# Patient Record
Sex: Male | Born: 1980 | Hispanic: Yes | Marital: Married | Smoking: Never smoker
Health system: Southern US, Community
[De-identification: ages and names within clinical notes are randomized; demographics above are authoritative.]

---

## 2011-04-14 ENCOUNTER — Emergency Department (INDEPENDENT_AMBULATORY_CARE_PROVIDER_SITE_OTHER): Payer: Worker's Compensation

## 2011-04-14 ENCOUNTER — Emergency Department (HOSPITAL_BASED_OUTPATIENT_CLINIC_OR_DEPARTMENT_OTHER)
Admission: EM | Admit: 2011-04-14 | Discharge: 2011-04-15 | Disposition: A | Discharge status: Home / Self Care | Payer: Worker's Compensation | Attending: Emergency Medicine | Admitting: Emergency Medicine

## 2011-04-14 ENCOUNTER — Encounter: Payer: Self-pay | Admitting: *Deleted

## 2011-04-14 DIAGNOSIS — W261XXA Contact with sword or dagger, initial encounter: Secondary | ICD-10-CM

## 2011-04-14 DIAGNOSIS — S61209A Unspecified open wound of unspecified finger without damage to nail, initial encounter: Secondary | ICD-10-CM

## 2011-04-14 DIAGNOSIS — S61219A Laceration without foreign body of unspecified finger without damage to nail, initial encounter: Secondary | ICD-10-CM

## 2011-04-14 DIAGNOSIS — Y9289 Other specified places as the place of occurrence of the external cause: Secondary | ICD-10-CM | POA: Insufficient documentation

## 2011-04-14 DIAGNOSIS — W260XXA Contact with knife, initial encounter: Secondary | ICD-10-CM | POA: Insufficient documentation

## 2011-04-14 NOTE — ED Notes (Signed)
Laceration to left thumb from box cutter

## 2011-04-14 NOTE — ED Notes (Signed)
Pt states this was an on the job injury, but denies needing a drug screen.

## 2011-04-15 MED ORDER — TETANUS-DIPHTH-ACELL PERTUSSIS 5-2.5-18.5 LF-MCG/0.5 IM SUSP
0.5000 mL | Freq: Once | INTRAMUSCULAR | Status: AC
  Administered 2011-04-15: 0.5 mL via INTRAMUSCULAR
  Filled 2011-04-15: qty 0.5

## 2011-04-15 MED ORDER — LIDOCAINE HCL (PF) 1 % IJ SOLN
INTRAMUSCULAR | Status: AC
  Administered 2011-04-15: 01:00:00
  Filled 2011-04-15: qty 5

## 2011-04-15 MED ORDER — LIDOCAINE HCL (PF) 1 % IJ SOLN
5.0000 mL | Freq: Once | INTRAMUSCULAR | Status: AC
  Administered 2011-04-15: 5 mL

## 2011-04-15 NOTE — ED Notes (Signed)
Laceration repair performed by Dr Bernette Mayers. Two stiches placed. Pt tolerated well. Site cleaned with saline, bacitracin applied, and gauze applied.

## 2011-04-15 NOTE — ED Provider Notes (Signed)
History     CSN: [id number] Arrival date & time: 04/14/2011 10:44 PM  Chief Complaint  Patient presents with  . Laceration   HPI Pt reports he cut his L thumb with a knife at work just prior to arrival. Complaining of mild, sharp pain, worse with movement. Bleeding is controlled. Tetanus is unknown.   History reviewed. No pertinent past medical history.  History reviewed. No pertinent past surgical history.  History reviewed. No pertinent family history.  History  Substance Use Topics  . Smoking status: Never Smoker   . Smokeless tobacco: Not on file  . Alcohol Use: No      Review of Systems All other systems reviewed and are negative except as noted in HPI.   Physical Exam  BP 115/75  Pulse 64  Temp(Src) 98.5 F (36.9 C) (Oral)  Resp 16  Ht 5\' 5"  (1.651 m)  Wt 155 lb (70.308 kg)  BMI 25.79 kg/m2  SpO2 100%  Physical Exam  Constitutional: He is oriented to person, place, and time. He appears well-developed and well-nourished.  HENT:  Head: Normocephalic and atraumatic.  Neck: Neck supple.  Pulmonary/Chest: Effort normal.  Neurological: He is alert and oriented to person, place, and time. No cranial nerve deficit.  Skin:       2cm superficial laceration to L thenar eminence  Psychiatric: He has a normal mood and affect. His behavior is normal.    ED Course  LACERATION REPAIR Date/Time: 04/15/2011 1:07 AM Performed by: Susy Frizzle B. Authorized by: Pollyann Savoy Consent: Verbal consent obtained. Consent given by: patient Time out: Immediately prior to procedure a "time out" was called to verify the correct patient, procedure, equipment, support staff and site/side marked as required. Body area: upper extremity Location details: left thumb Laceration length: 2 cm Foreign bodies: no foreign bodies Anesthesia: local infiltration Local anesthetic: lidocaine 1% without epinephrine Preparation: Patient was prepped and draped in the usual sterile  fashion. Irrigation solution: saline Irrigation method: syringe Amount of cleaning: standard Debridement: none Skin closure: 4-0 nylon Number of sutures: 2 Technique: simple Approximation: close Approximation difficulty: simple    MDM Wound care instructions given. Tetanus updated.       Teagyn Fishel B. Bernette Mayers, MD 04/15/11 4098

## 2017-04-13 ENCOUNTER — Encounter (HOSPITAL_BASED_OUTPATIENT_CLINIC_OR_DEPARTMENT_OTHER): Payer: Self-pay | Admitting: *Deleted

## 2017-04-13 ENCOUNTER — Emergency Department (HOSPITAL_BASED_OUTPATIENT_CLINIC_OR_DEPARTMENT_OTHER): Payer: Worker's Compensation

## 2017-04-13 ENCOUNTER — Emergency Department (HOSPITAL_BASED_OUTPATIENT_CLINIC_OR_DEPARTMENT_OTHER)
Admission: EM | Admit: 2017-04-13 | Discharge: 2017-04-14 | Disposition: A | Discharge status: Home / Self Care | Payer: Worker's Compensation | Attending: Emergency Medicine | Admitting: Emergency Medicine

## 2017-04-13 DIAGNOSIS — Y9289 Other specified places as the place of occurrence of the external cause: Secondary | ICD-10-CM | POA: Diagnosis not present

## 2017-04-13 DIAGNOSIS — S161XXA Strain of muscle, fascia and tendon at neck level, initial encounter: Secondary | ICD-10-CM | POA: Insufficient documentation

## 2017-04-13 DIAGNOSIS — S199XXA Unspecified injury of neck, initial encounter: Secondary | ICD-10-CM | POA: Diagnosis present

## 2017-04-13 DIAGNOSIS — Y9389 Activity, other specified: Secondary | ICD-10-CM | POA: Diagnosis not present

## 2017-04-13 DIAGNOSIS — W11XXXA Fall on and from ladder, initial encounter: Secondary | ICD-10-CM | POA: Diagnosis not present

## 2017-04-13 DIAGNOSIS — Y990 Civilian activity done for income or pay: Secondary | ICD-10-CM | POA: Insufficient documentation

## 2017-04-13 DIAGNOSIS — W19XXXA Unspecified fall, initial encounter: Secondary | ICD-10-CM

## 2017-04-13 NOTE — ED Triage Notes (Signed)
Pt amb into triage , states fall from tree x 5 days ago, sent here from [facility] for  back and neck pain

## 2017-04-14 MED ORDER — IBUPROFEN 800 MG PO TABS: 800.0000 mg | ORAL_TABLET | Freq: Three times a day (TID) | ORAL | 0 refills | Status: DC

## 2017-04-14 MED ORDER — CYCLOBENZAPRINE HCL 10 MG PO TABS: 10.0000 mg | ORAL_TABLET | Freq: Two times a day (BID) | ORAL | 0 refills | Status: DC | PRN

## 2017-04-14 NOTE — Discharge Instructions (Signed)
Flexeril es un medicamento que se puede tomar para los espasmos musculares y la rigidez muscular. Puede tomar este medicamento ONEOKhasta dos veces por da. No conduzca ni trabaje despus de tomar este medicamento porque puede provocarle sueo. Puede tomar 800 mg de ibuprofeno cada 8 horas con alimentos segn sea necesario para controlar el dolor y la inflamacin. Si desarrolla un fuerte dolor de cabeza, visin doble u otros nuevos sntomas graves, regrese al departamento de emergencias para una nueva evaluacin.  Flexeril is a medicine that can be taken for muscle spasms and muscle tightness. You can take this medicine up to two times per day. Please do not drive or work after taking this medicine because it can make you sleepy.   You may take 800 mg of ibuprofen every 8 hours with food as needed for pain and inflammation control.   If you develop a severe headache, double vision, or other new severe symptoms please return to the Emergency Department for re-evaluation.

## 2017-04-14 NOTE — ED Provider Notes (Signed)
[facility]   CSN: [id number] Arrival date & time: 04/13/17  2058     History   Chief Complaint Chief Complaint  Patient presents with  . Fall    HPI Kenneth Ashley is a 36 y.o. male who presents to the emergency department with a chief complaint of fall x5 days ago and was sent to the emergency department after being evaluated at urgent care earlier today. He reports that he was working and a tree while standing on a ladder when he fell approximately 8-10 feet. He reports that he was able to ambulate. No loss of consciousness. No nausea or emesis. He reports that he has been able to work, but has had worsening neck and low back pain and right leg pain over the last 2 days. He denies numbness, tingling, diplopia, or severe headache. No urinary or fecal incontinence. He reports he has been ambulatory since the fall. He is treated his symptoms with diclofenac at home without improvement. No history of previous neck or back surgery.  The history is provided by the patient. No language interpreter was used.    History reviewed. No pertinent past medical history.  There are no active problems to display for this patient.   History reviewed. No pertinent surgical history.     Home Medications    Prior to Admission medications   Medication Sig Start Date End Date Taking? Authorizing Provider  cyclobenzaprine (FLEXERIL) 10 MG tablet Take 1 tablet (10 mg total) by mouth 2 (two) times daily as needed for muscle spasms. 04/14/17   Bryndan Bilyk A, PA-C  ibuprofen (ADVIL,MOTRIN) 800 MG tablet Take 1 tablet (800 mg total) by mouth 3 (three) times daily. 04/14/17   Larnie Heart A, PA-C    Family History No family history on file.  Social History Social History  Substance Use Topics  . Smoking status: Never Smoker  . Smokeless tobacco: Not on file  . Alcohol use No     Allergies   Patient has no known allergies.   Review of Systems Review of Systems    Constitutional: Negative for activity change.  Eyes: Negative for visual disturbance.  Respiratory: Negative for shortness of breath.   Cardiovascular: Negative for chest pain.  Gastrointestinal: Negative for abdominal pain.  Musculoskeletal: Positive for arthralgias, back pain, myalgias and neck pain. Negative for gait problem and joint swelling.  Skin: Negative for rash.     Physical Exam Updated Vital Signs BP 122/85 (BP Location: Left Arm)   Pulse 67   Temp 98.5 F (36.9 C) (Oral)   Resp 14   Ht 5' 5.5" (1.664 m)   Wt 87.1 kg (192 lb)   SpO2 100%   BMI 31.46 kg/m   Physical Exam  Constitutional: He appears well-developed.  HENT:  Head: Normocephalic.  Eyes: Pupils are equal, round, and reactive to light. Conjunctivae and EOM are normal.  Neck: Normal range of motion. Neck supple. No tracheal deviation present.  Full active and passive range of motion with flexion, extension, rotation, and lateral flexion.  Cardiovascular: Normal rate, regular rhythm and normal heart sounds.  Exam reveals no gallop and no friction rub.   No murmur heard. Pulmonary/Chest: Effort normal and breath sounds normal. No respiratory distress. He has no wheezes. He has no rales.  Abdominal: Soft. He exhibits no distension.  Musculoskeletal:  No tenderness to palpation over the cervical, thoracic, or lumbar spinous processes. Tenderness to palpation over the bilateral cervical paraspinal muscles. The bilateral trapezius  muscles appear tight and thickened. No tenderness to palpation over the paraspinal muscles of the thoracic spine. Diffuse tenderness to palpation over the muscles of the bilateral lower back.  There is a 3 x 3 area of ecchymosis to the right buttock. Tender to palpation over the right mid lower leg with overlying ecchymosis.  Ambulatory without difficulty.  Neurological: He is alert.  Skin: Skin is warm and dry.  Psychiatric: His behavior is normal.  Nursing note and vitals  reviewed.    ED Treatments / Results  Labs (all labs ordered are listed, but only abnormal results are displayed) Labs Reviewed - No data to display  EKG  EKG Interpretation None       Radiology Dg Cervical Spine Complete  Result Date: 04/13/2017 CLINICAL DATA:  Cervical neck pain radiating to both shoulders after fall last tree at work 5 days prior. EXAM: CERVICAL SPINE - COMPLETE 4+ VIEW COMPARISON:  None. FINDINGS: Cervical spine alignment is maintained. Vertebral body heights and intervertebral disc spaces are preserved. The dens is intact. Posterior elements appear well-aligned. There is no evidence of fracture. No prevertebral soft tissue edema. IMPRESSION: Negative cervical spine radiographs. No radiographic evidence of acute fracture. Electronically Signed   By: Rubye OaksMelanie  Ehinger M.D.   On: 04/13/2017 22:18   Dg Lumbar Spine Complete  Result Date: 04/13/2017 CLINICAL DATA:  Lumbosacral back pain radiating to both hips after fall off tree at work 5 days prior. EXAM: LUMBAR SPINE - COMPLETE 4+ VIEW COMPARISON:  None. FINDINGS: The alignment is maintained. Vertebral body heights are normal. There is no listhesis. The posterior elements are intact. Disc spaces are preserved. No fracture. Sacroiliac joints are symmetric and normal. IMPRESSION: Negative radiographs of the lumbar spine. Electronically Signed   By: Rubye OaksMelanie  Ehinger M.D.   On: 04/13/2017 22:17   Dg Knee Complete 4 Views Right  Result Date: 04/13/2017 CLINICAL DATA:  Right knee pain after fall off tree at work 5 days prior. EXAM: RIGHT KNEE - COMPLETE 4+ VIEW COMPARISON:  None. FINDINGS: No evidence of fracture, dislocation, or joint effusion. No evidence of arthropathy or other focal bone abnormality. Soft tissues are unremarkable. IMPRESSION: No fracture, dislocation, or joint effusion. Electronically Signed   By: Rubye OaksMelanie  Ehinger M.D.   On: 04/13/2017 22:16    Procedures Procedures (including critical care  time)  Medications Ordered in ED Medications - No data to display   Initial Impression / Assessment and Plan / ED Course  I have reviewed the triage vital signs and the nursing notes.  Pertinent labs & imaging results that were available during my care of the patient were reviewed by me and considered in my medical decision making (see chart for details).     36 year old male presenting with neck pain and low back pain, and right leg pain after he sustained a fall off a ladder 5 days ago. X-rays of the cervical spine, lumbar spine, and right knee are unremarkable. Normal neck exam with active and passive range of motion. No tenderness to palpation of the cervical, thoracic, or lumbar spinous processes. Lumbar spine exam is unremarkable.The patient does not have a severe headache. At this time, low suspicion for vertebral dissection as the patient's neck pain is more along the lateral sides of the posterior neck.  The patient's musculature of the posterior neck feels tight. No red flags on low back exam. The patient is ambulatory without difficulty. The patient's tenderness to palpation over the right leg is in the lower extremity,  consistent with the ecchymosis on the anterior aspect. Discussed with the patient that there are no red flags on his exam or imaging. Discharge the patient with Flexeril, ibuprofen, and conservative management with ice and rest. Work note given. Strict return precautions given including if the patient develops a severe headache, or visual changes, or numbness or weakness. The patient acknowledges th at this time. Vital signs stable. No acute distress. The patient for discharge at this time plan.   Final Clinical Impressions(s) / ED Diagnoses   Final diagnoses:  Fall, initial encounter  Strain of neck muscle, initial encounter    New Prescriptions Discharge Medication List as of 04/14/2017  1:40 AM    START taking these medications   Details  cyclobenzaprine  (FLEXERIL) 10 MG tablet Take 1 tablet (10 mg total) by mouth 2 (two) times daily as needed for muscle spasms., Starting Wed 04/14/2017, Print    ibuprofen (ADVIL,MOTRIN) 800 MG tablet Take 1 tablet (800 mg total) by mouth 3 (three) times daily., Starting Wed 04/14/2017, Print         Cianni Manny A, PA-C 04/14/17 0422    Shon Baton, MD 04/14/17 [phone]

## 2018-10-13 IMAGING — DX DG KNEE COMPLETE 4+V*R*
4 series · 4 of 4 positions shown · non-contrast
Comparison: None.

CLINICAL DATA: Right knee pain after fall off tree at work 5 days
prior.

EXAM:
RIGHT KNEE - COMPLETE 4+ VIEW

[knee ap]
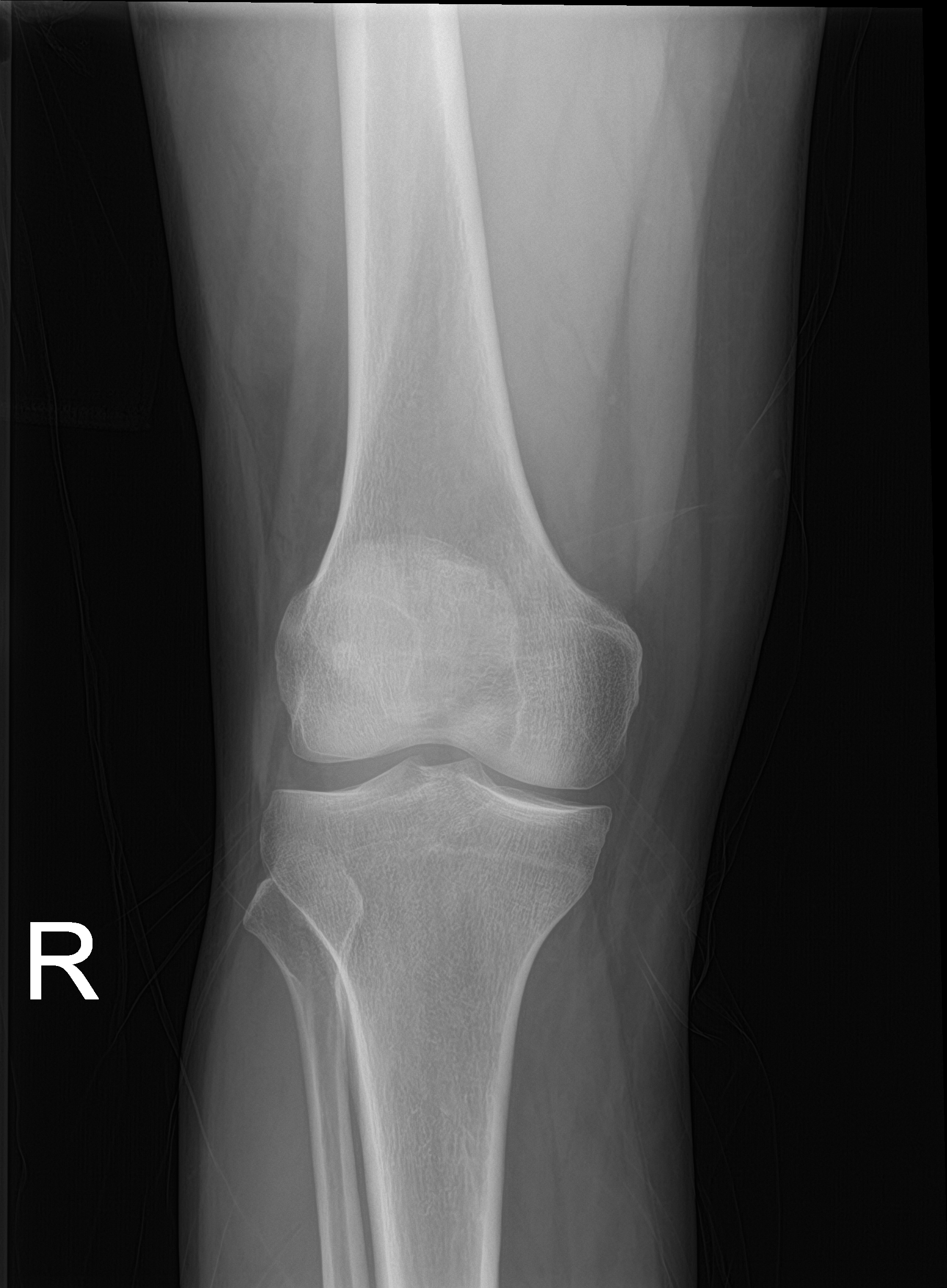

[knee lat]
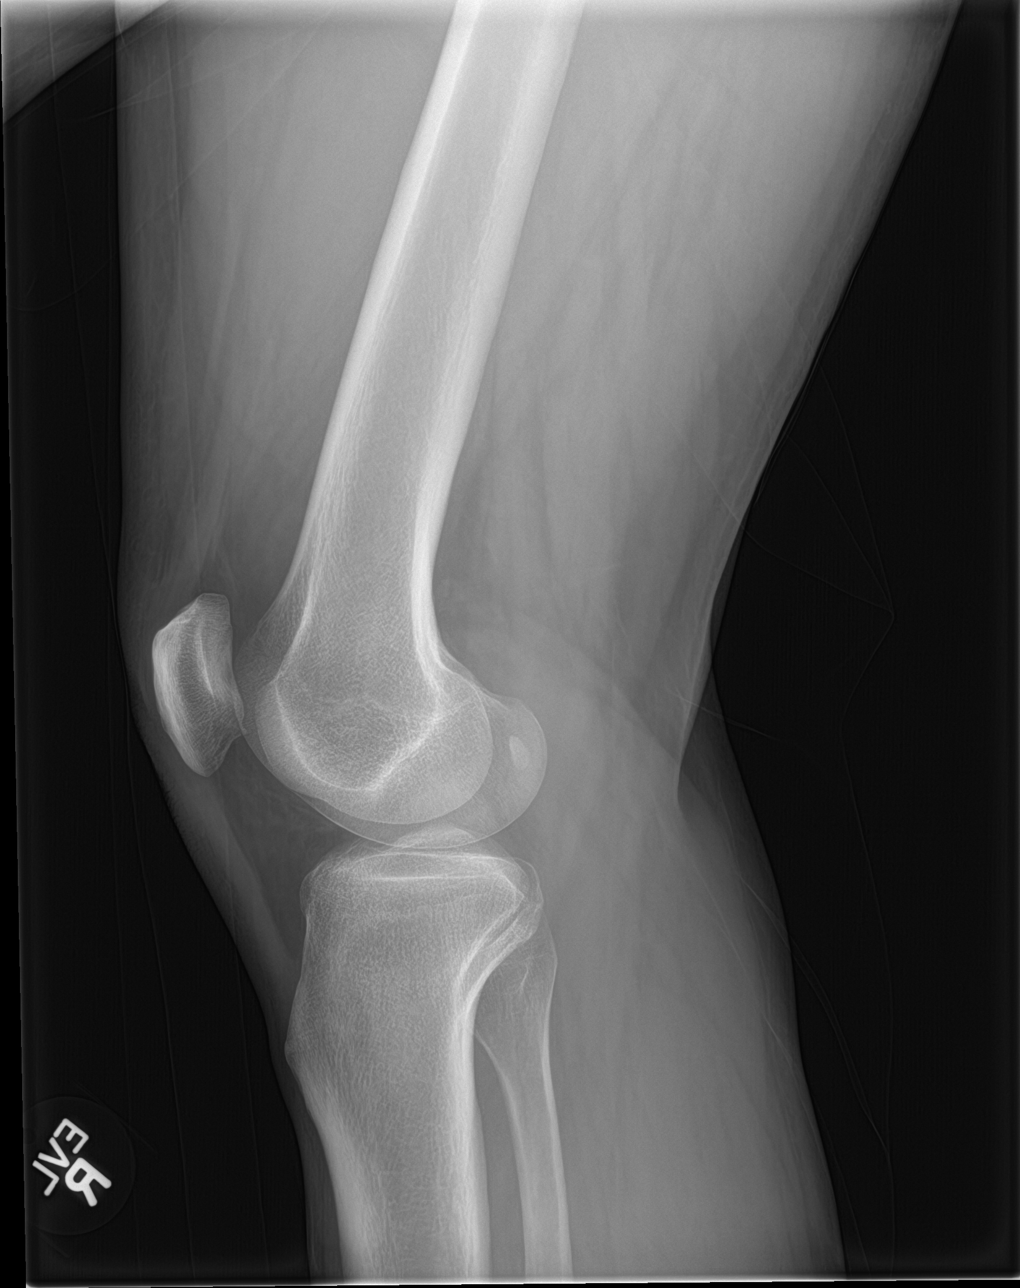

[knee obl (1 of 2)]
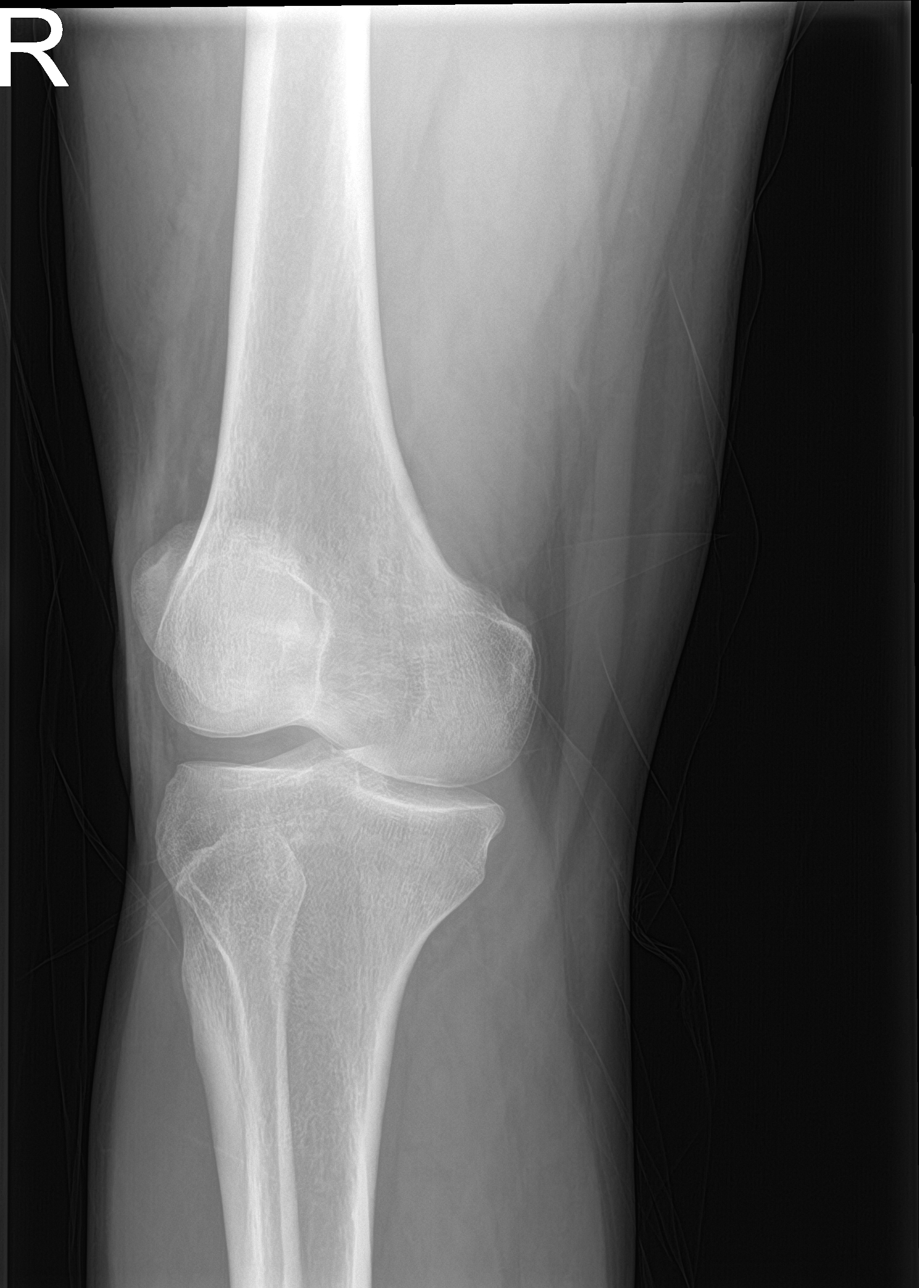

[knee obl (2 of 2)]
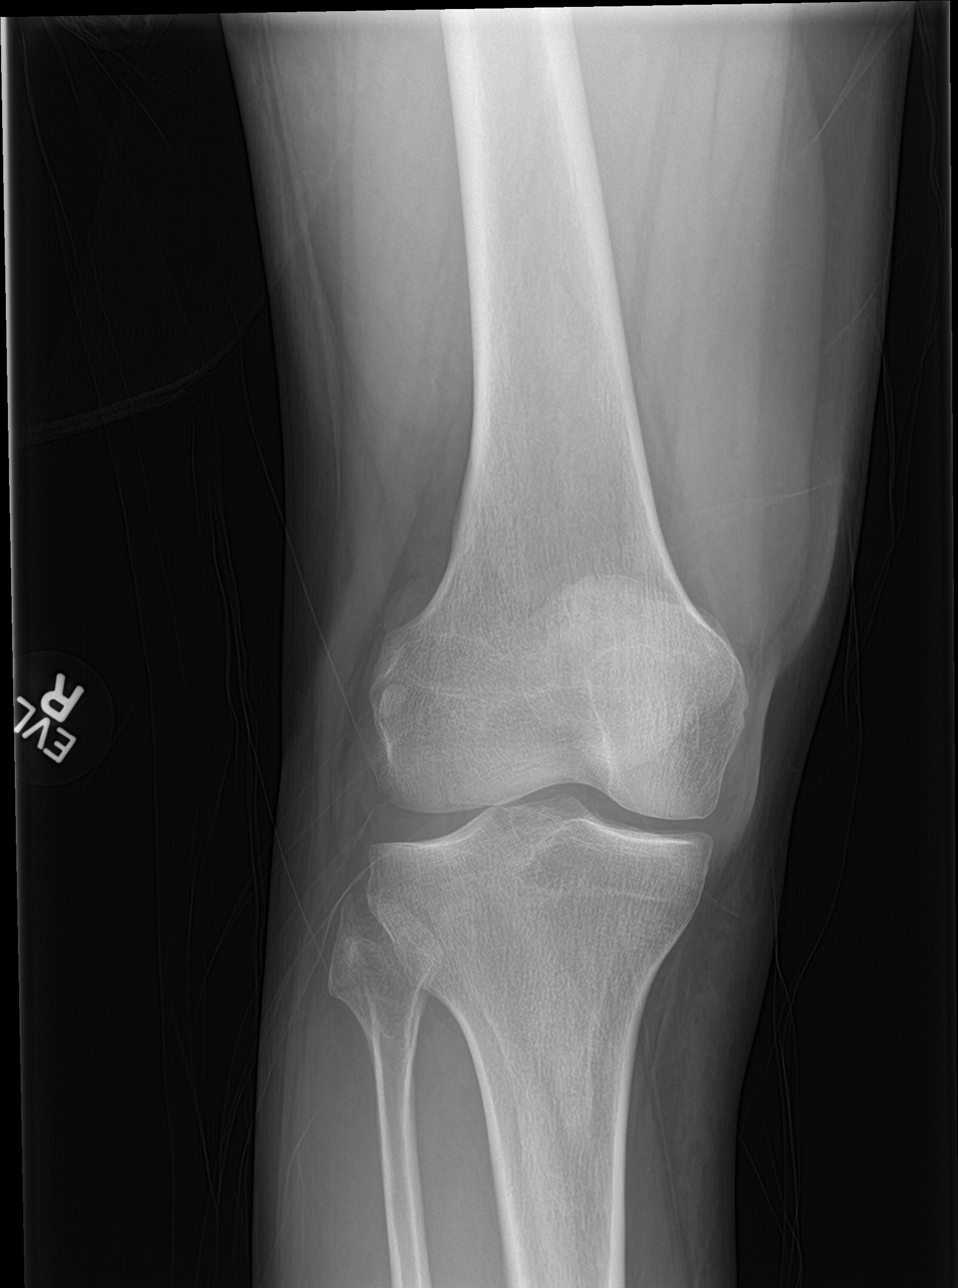

[4 of 4 positions shown; findings below may reference images not displayed]

FINDINGS: No evidence of fracture, dislocation, or joint effusion. No evidence
of arthropathy or other focal bone abnormality. Soft tissues are
unremarkable.
IMPRESSION: No fracture, dislocation, or joint effusion.

## 2018-10-13 IMAGING — DX DG CERVICAL SPINE COMPLETE 4+V
7 series · 9 of 9 positions shown · non-contrast
Comparison: None.

CLINICAL DATA: Cervical neck pain radiating to both shoulders after
fall last tree at work 5 days prior.

EXAM:
CERVICAL SPINE - COMPLETE 4+ VIEW

[c-spine lat]
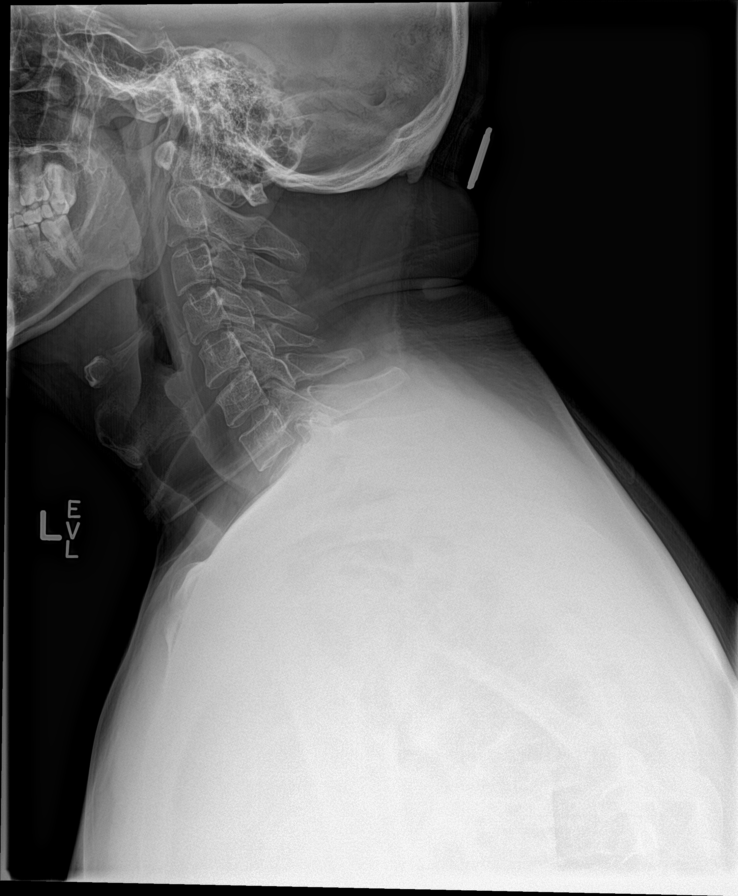

[c-spine obl (1 of 2)]
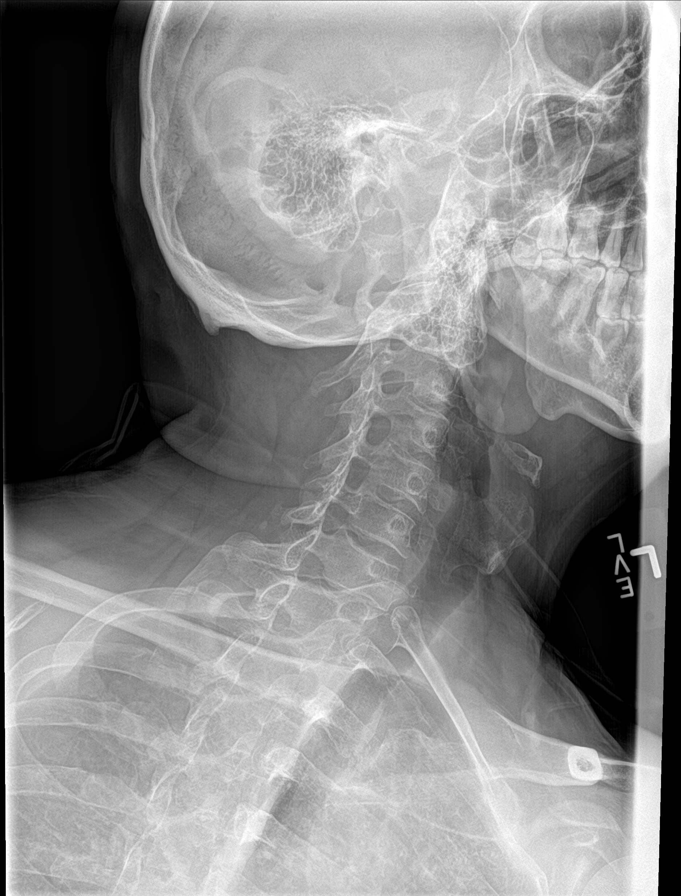

[c-spine obl (2 of 2)]
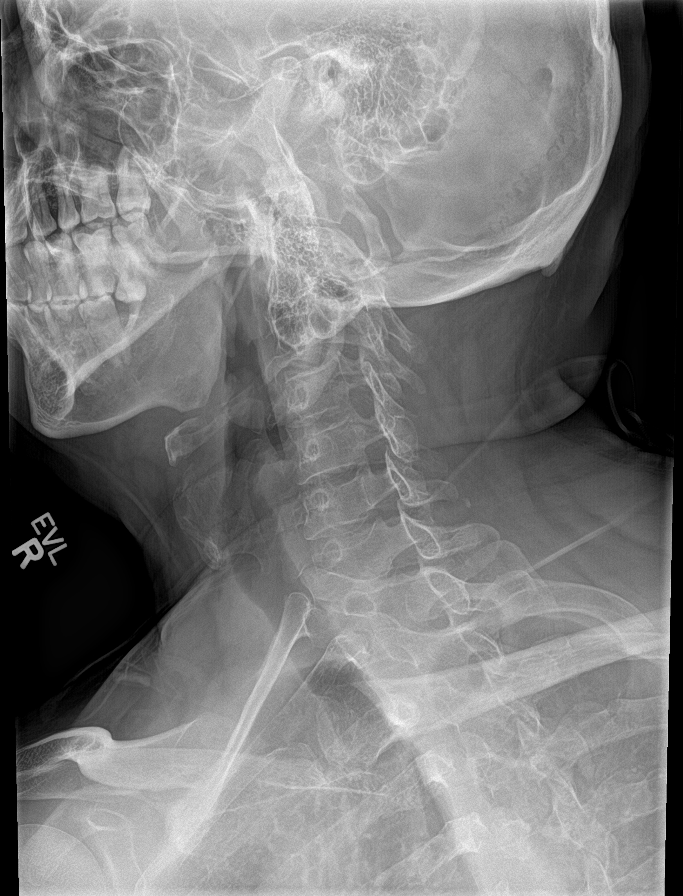

[c-spine ap]
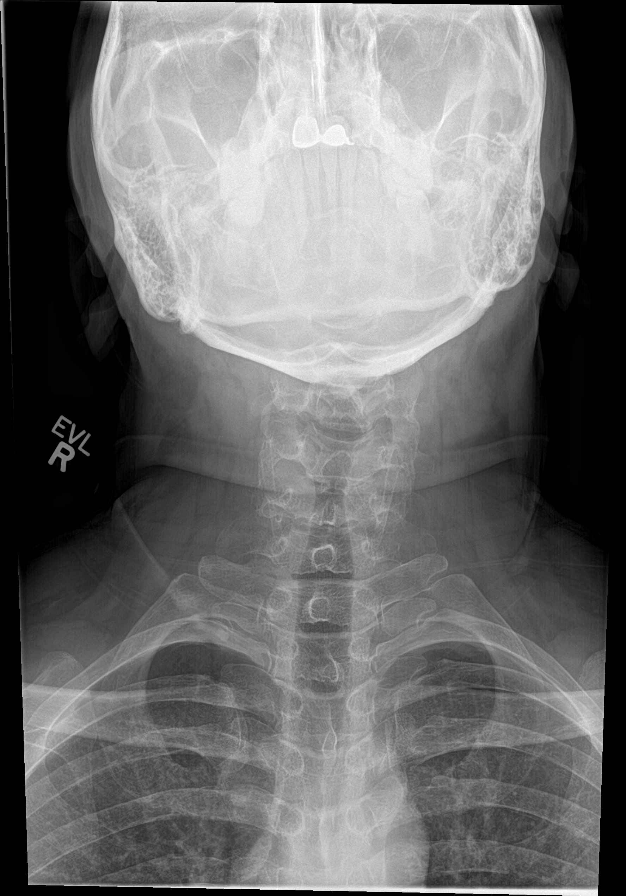

[Series 5: c-spine open mouth · 0.14mm/px · 3 of 3 slices shown]
[im 1/3]
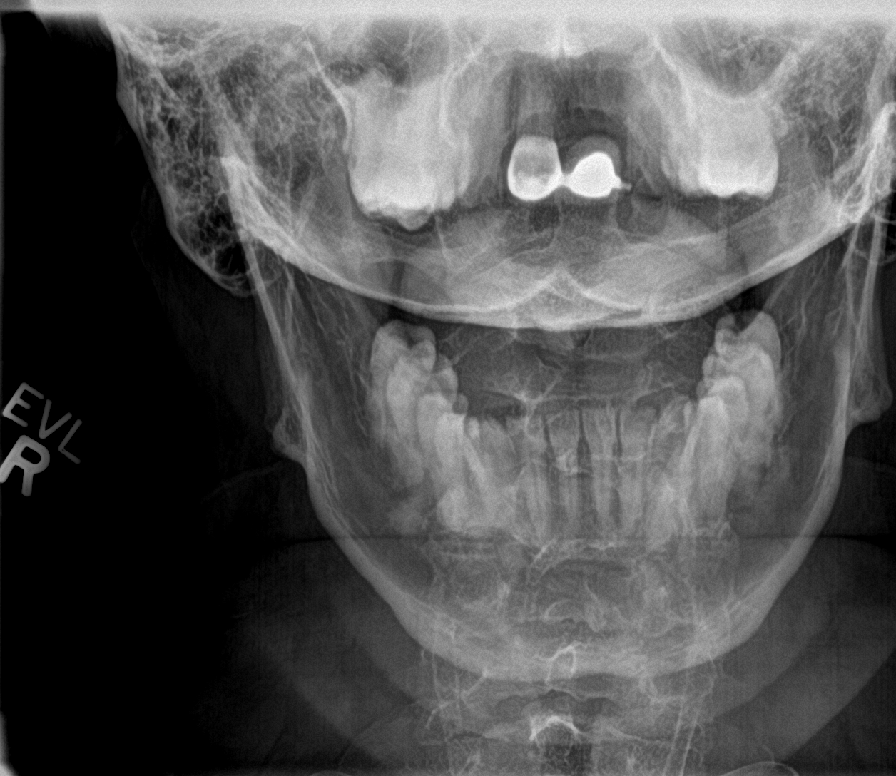
[im 2/3]
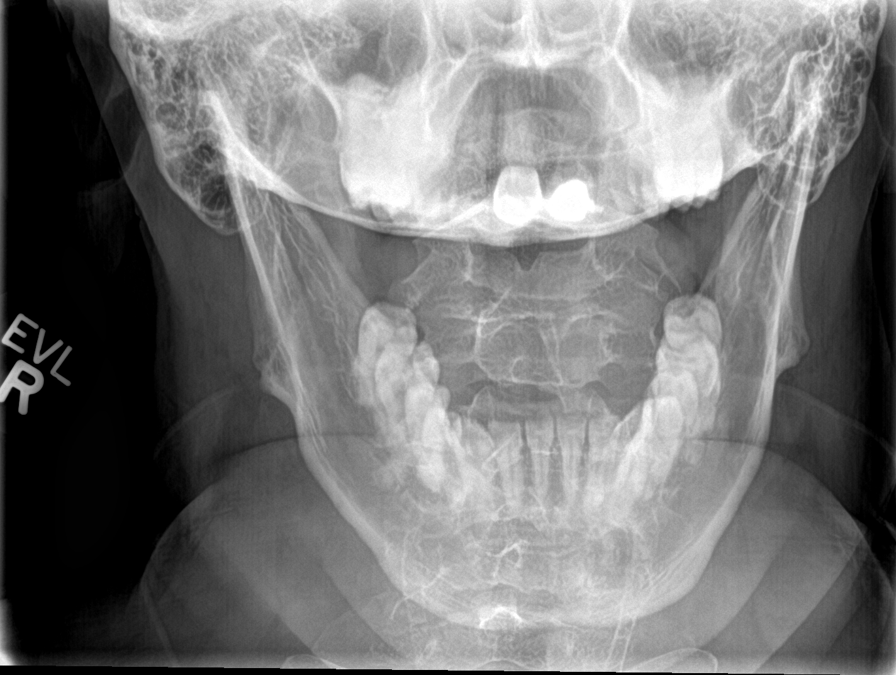
[im 3/3]
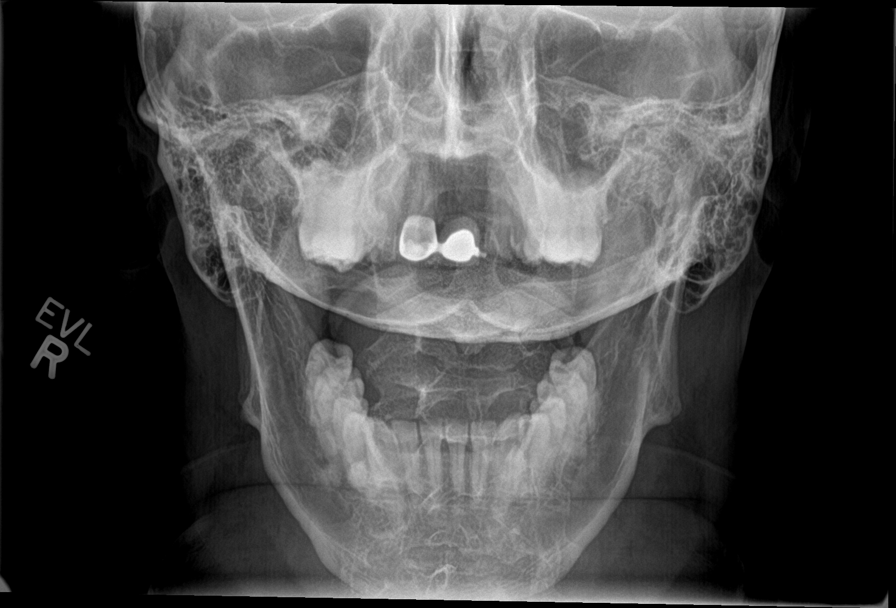

[c-spine swimmers]
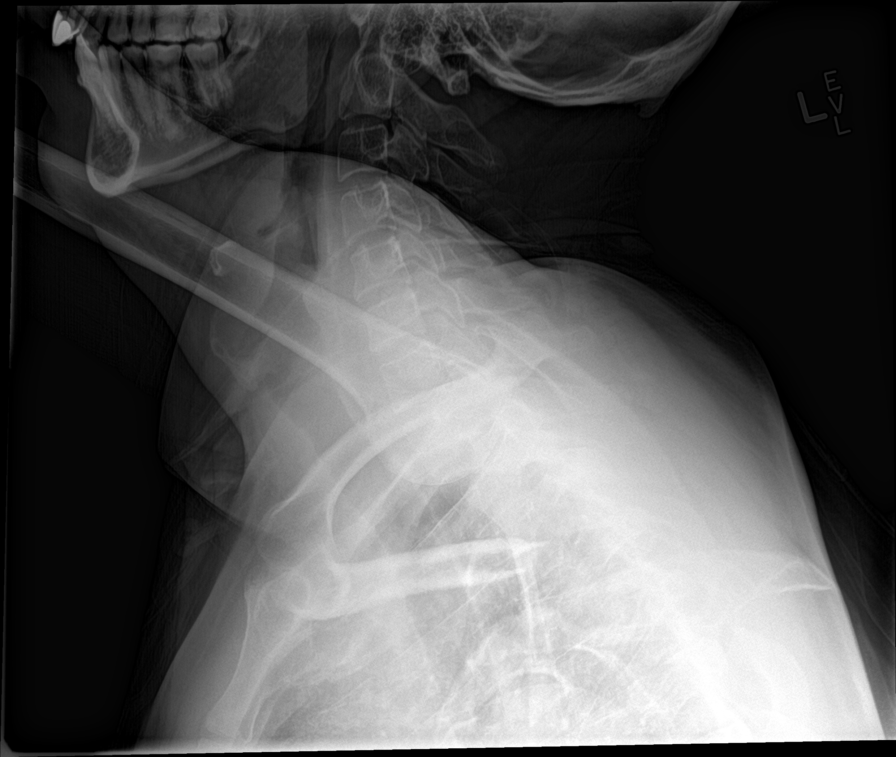

[[person_name]]
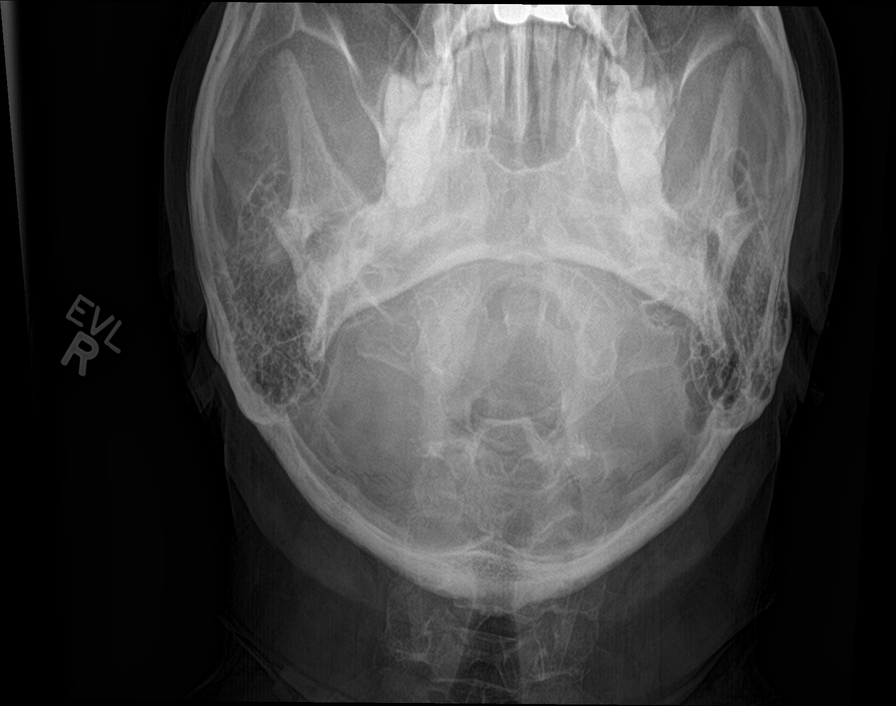

[9 of 9 positions shown; findings below may reference images not displayed]

FINDINGS: Cervical spine alignment is maintained. Vertebral body heights and
intervertebral disc spaces are preserved. The dens is intact.
Posterior elements appear well-aligned. There is no evidence of
fracture. No prevertebral soft tissue edema.
IMPRESSION: Negative cervical spine radiographs. No radiographic evidence of
acute fracture.

## 2022-05-25 ENCOUNTER — Encounter (HOSPITAL_BASED_OUTPATIENT_CLINIC_OR_DEPARTMENT_OTHER): Payer: Self-pay | Admitting: Emergency Medicine

## 2022-05-25 ENCOUNTER — Other Ambulatory Visit: Payer: Self-pay

## 2022-05-25 ENCOUNTER — Emergency Department (HOSPITAL_BASED_OUTPATIENT_CLINIC_OR_DEPARTMENT_OTHER): Payer: Worker's Compensation

## 2022-05-25 ENCOUNTER — Emergency Department (HOSPITAL_BASED_OUTPATIENT_CLINIC_OR_DEPARTMENT_OTHER)
Admission: EM | Admit: 2022-05-25 | Discharge: 2022-05-25 | Disposition: A | Discharge status: Home / Self Care | Payer: Worker's Compensation | Attending: Emergency Medicine | Admitting: Emergency Medicine

## 2022-05-25 DIAGNOSIS — W228XXA Striking against or struck by other objects, initial encounter: Secondary | ICD-10-CM | POA: Diagnosis not present

## 2022-05-25 DIAGNOSIS — S301XXA Contusion of abdominal wall, initial encounter: Secondary | ICD-10-CM | POA: Insufficient documentation

## 2022-05-25 DIAGNOSIS — S3991XA Unspecified injury of abdomen, initial encounter: Secondary | ICD-10-CM | POA: Diagnosis present

## 2022-05-25 DIAGNOSIS — Y990 Civilian activity done for income or pay: Secondary | ICD-10-CM | POA: Insufficient documentation

## 2022-05-25 MED ORDER — NAPROXEN 375 MG PO TABS: 375.0000 mg | ORAL_TABLET | Freq: Two times a day (BID) | ORAL | 0 refills | Status: AC

## 2022-05-25 MED ORDER — CYCLOBENZAPRINE HCL 10 MG PO TABS: 10.0000 mg | ORAL_TABLET | Freq: Two times a day (BID) | ORAL | 0 refills | Status: AC | PRN

## 2022-05-25 NOTE — ED Triage Notes (Signed)
Pt arrives pov, steady gait, c/o RT back injury after being hit with lumber at work x 1 week pta.

## 2022-05-25 NOTE — Discharge Instructions (Signed)
The x-rays did not show any signs of broken bones or lung injury.  Take the medications as needed for pain.  The symptoms should improve over the next week

## 2022-05-25 NOTE — ED Provider Notes (Signed)
[facility] EMERGENCY DEPARTMENT Provider Note   CSN: [id number] Arrival date & time: 05/25/22  1759     History  Chief Complaint  Patient presents with   Back Injury    Kenneth Ashley is a 41 y.o. male.  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  One of the boards ended up hitting him in the right flank lateral rib area on the right side.  Patient states he has had pain in that area since then.  He denies any hematuria.  No shortness of breath.  No abdominal pain.  Home Medications Prior to Admission medications   Medication Sig Start Date End Date Taking? Authorizing Provider  cyclobenzaprine (FLEXERIL) 10 MG tablet Take 1 tablet (10 mg total) by mouth 2 (two) times daily as needed for muscle spasms. 05/25/22  Yes Dorie Rank, MD  naproxen (NAPROSYN) 375 MG tablet Take 1 tablet (375 mg total) by mouth 2 (two) times daily. 05/25/22  Yes Dorie Rank, MD      Allergies    Patient has no known allergies.    Review of Systems   Review of Systems  Physical Exam Updated Vital Signs BP (!) 153/81 (BP Location: Left Arm)   Pulse 83   Temp 99.5 F (37.5 C) (Oral)   Resp 20   Ht 1.664 m (5' 5.5")   Wt 78.5 kg   SpO2 100%   BMI 28.35 kg/m  Physical Exam Vitals and nursing note reviewed.  Constitutional:      General: He is not in acute distress.    Appearance: He is well-developed.  HENT:     Head: Normocephalic and atraumatic.     Right Ear: External ear normal.     Left Ear: External ear normal.  Eyes:     General: No scleral icterus.       Right eye: No discharge.        Left eye: No discharge.     Conjunctiva/sclera: Conjunctivae normal.  Neck:     Trachea: No tracheal deviation.  Cardiovascular:     Rate and Rhythm: Normal rate.  Pulmonary:     Effort: Pulmonary effort is normal. No respiratory distress.     Breath sounds: No stridor.  Abdominal:     General: There is no distension.     Tenderness: There is no  abdominal tenderness. There is right CVA tenderness.     Comments: No contusion noted, abdomen soft nontender  Musculoskeletal:        General: No swelling or deformity.     Cervical back: Neck supple.  Skin:    General: Skin is warm and dry.     Findings: No rash.  Neurological:     Mental Status: He is alert.     Cranial Nerves: Cranial nerve deficit: no gross deficits.     ED Results / Procedures / Treatments   Labs (all labs ordered are listed, but only abnormal results are displayed) Labs Reviewed - No data to display  EKG None  Radiology DG Ribs Unilateral W/Chest Right  Result Date: 05/25/2022 CLINICAL DATA:  Right back injury EXAM: RIGHT RIBS AND CHEST - 3+ VIEW COMPARISON:  None Available. FINDINGS: Lungs are clear.  No pleural effusion or pneumothorax. The heart is normal in size. No displaced rib fracture is seen. IMPRESSION: Negative. Electronically Signed   By: Julian Hy M.D.   On: 05/25/2022 18:35    Procedures Procedures    Medications  Ordered in ED Medications - No data to display  ED Course/ Medical Decision Making/ A&P                           Medical Decision Making Problems Addressed: Contusion of flank, initial encounter: acute illness or injury  Amount and/or Complexity of Data Reviewed Radiology: ordered and independent interpretation performed.    Details: X-rays without signs of fracture or pneumothorax  Risk Prescription drug management.   No signs of any fracture or pneumothorax on x-rays.  Patient has not noticed any hematuria to suggest occult renal injury.  He is not having any abdominal pain no other systemic symptoms.  Likely soft tissue contusion  Evaluation and diagnostic testing in the emergency department does not suggest an emergent condition requiring admission or immediate intervention beyond what has been performed at this time.  The patient is safe for discharge and has been instructed to return immediately for  worsening symptoms, change in symptoms or any other concerns.        Final Clinical Impression(s) / ED Diagnoses Final diagnoses:  Contusion of flank, initial encounter    Rx / DC Orders ED Discharge Orders          Ordered    naproxen (NAPROSYN) 375 MG tablet  2 times daily        05/25/22 1918    cyclobenzaprine (FLEXERIL) 10 MG tablet  2 times daily PRN        05/25/22 1918              Linwood Dibbles, MD 05/25/22 1920
